# Patient Record
Sex: Male | Born: 1975 | Race: White | Hispanic: No | Marital: Single | Smoking: Never smoker
Health system: Southern US, Community
[De-identification: ages and names within clinical notes are randomized; demographics above are authoritative.]

## PROBLEM LIST (undated history)

## (undated) DIAGNOSIS — C801 Malignant (primary) neoplasm, unspecified: Secondary | ICD-10-CM

## (undated) DIAGNOSIS — I7782 Antineutrophilic cytoplasmic antibody (ANCA) vasculitis: Secondary | ICD-10-CM

## (undated) DIAGNOSIS — I219 Acute myocardial infarction, unspecified: Secondary | ICD-10-CM

## (undated) DIAGNOSIS — I776 Arteritis, unspecified: Secondary | ICD-10-CM

## (undated) DIAGNOSIS — I10 Essential (primary) hypertension: Secondary | ICD-10-CM

## (undated) DIAGNOSIS — I2510 Atherosclerotic heart disease of native coronary artery without angina pectoris: Secondary | ICD-10-CM

## (undated) HISTORY — PX: BRAIN BIOPSY: SHX905

## (undated) HISTORY — PX: HERNIA REPAIR: SHX51

---

## 2019-11-21 ENCOUNTER — Other Ambulatory Visit: Payer: Self-pay

## 2019-11-21 ENCOUNTER — Encounter (HOSPITAL_BASED_OUTPATIENT_CLINIC_OR_DEPARTMENT_OTHER): Payer: Self-pay

## 2019-11-21 ENCOUNTER — Emergency Department (HOSPITAL_BASED_OUTPATIENT_CLINIC_OR_DEPARTMENT_OTHER)
Admission: EM | Admit: 2019-11-21 | Discharge: 2019-11-21 | Disposition: A | Discharge status: Home / Self Care | Payer: 59 | Attending: Emergency Medicine | Admitting: Emergency Medicine

## 2019-11-21 DIAGNOSIS — I10 Essential (primary) hypertension: Secondary | ICD-10-CM | POA: Diagnosis not present

## 2019-11-21 DIAGNOSIS — R358 Other polyuria: Secondary | ICD-10-CM | POA: Insufficient documentation

## 2019-11-21 DIAGNOSIS — R631 Polydipsia: Secondary | ICD-10-CM | POA: Insufficient documentation

## 2019-11-21 DIAGNOSIS — Z79899 Other long term (current) drug therapy: Secondary | ICD-10-CM | POA: Diagnosis not present

## 2019-11-21 DIAGNOSIS — Z7901 Long term (current) use of anticoagulants: Secondary | ICD-10-CM | POA: Diagnosis not present

## 2019-11-21 DIAGNOSIS — C719 Malignant neoplasm of brain, unspecified: Secondary | ICD-10-CM | POA: Insufficient documentation

## 2019-11-21 DIAGNOSIS — R739 Hyperglycemia, unspecified: Secondary | ICD-10-CM | POA: Diagnosis present

## 2019-11-21 HISTORY — DX: Atherosclerotic heart disease of native coronary artery without angina pectoris: I25.10

## 2019-11-21 HISTORY — DX: Antineutrophilic cytoplasmic antibody (ANCA) vasculitis: I77.82

## 2019-11-21 HISTORY — DX: Essential (primary) hypertension: I10

## 2019-11-21 HISTORY — DX: Acute myocardial infarction, unspecified: I21.9

## 2019-11-21 HISTORY — DX: Arteritis, unspecified: I77.6

## 2019-11-21 HISTORY — DX: Malignant (primary) neoplasm, unspecified: C80.1

## 2019-11-21 LAB — CBG MONITORING, ED
Glucose-Capillary: >600 mg/dL (ref 70–99)
Glucose-Capillary: 451 mg/dL — ABNORMAL HIGH (ref 70–99)
Glucose-Capillary: 296 mg/dL — ABNORMAL HIGH (ref 70–99)

## 2019-11-21 LAB — BASIC METABOLIC PANEL
Anion gap: 14 (ref 5–15)
BUN: 20 mg/dL (ref 6–20)
CO2: 22 mmol/L (ref 22–32)
Calcium: 9.2 mg/dL (ref 8.9–10.3)
Chloride: 93 mmol/L — ABNORMAL LOW (ref 98–111)
Creatinine, Ser: 1.37 mg/dL — ABNORMAL HIGH (ref 0.61–1.24)
GFR calc Af Amer: >60 mL/min (ref >60)
GFR calc non Af Amer: >60 mL/min (ref >60)
Glucose, Bld: 732 mg/dL (ref 70–99)
Potassium: 4.6 mmol/L (ref 3.5–5.1)
Sodium: 129 mmol/L — ABNORMAL LOW (ref 135–145)

## 2019-11-21 LAB — CBC WITH DIFFERENTIAL/PLATELET
Abs Immature Granulocytes: 0.2 10*3/uL — ABNORMAL HIGH (ref 0.00–0.07)
Basophils Absolute: 0 10*3/uL (ref 0.0–0.1)
Basophils Relative: 0 %
Eosinophils Absolute: 0 10*3/uL (ref 0.0–0.5)
Eosinophils Relative: 0 %
HCT: 46.2 % (ref 39.0–52.0)
Hemoglobin: 15.9 g/dL (ref 13.0–17.0)
Immature Granulocytes: 2 %
Lymphocytes Relative: 3 %
Lymphs Abs: 0.2 10*3/uL — ABNORMAL LOW (ref 0.7–4.0)
MCH: 33.3 pg (ref 26.0–34.0)
MCHC: 34.4 g/dL (ref 30.0–36.0)
MCV: 96.7 fL (ref 80.0–100.0)
Monocytes Absolute: 0.4 10*3/uL (ref 0.1–1.0)
Monocytes Relative: 4 %
Neutro Abs: 7.9 10*3/uL — ABNORMAL HIGH (ref 1.7–7.7)
Neutrophils Relative %: 91 %
Platelets: 163 10*3/uL (ref 150–400)
RBC: 4.78 MIL/uL (ref 4.22–5.81)
RDW: 14.1 % (ref 11.5–15.5)
WBC: 8.7 10*3/uL (ref 4.0–10.5)
nRBC: 0 % (ref 0.0–0.2)

## 2019-11-21 LAB — URINALYSIS, MICROSCOPIC (REFLEX)

## 2019-11-21 LAB — URINALYSIS, ROUTINE W REFLEX MICROSCOPIC
Bilirubin Urine: NEGATIVE
Glucose, UA: >=500 mg/dL — AB
Ketones, ur: NEGATIVE mg/dL
Leukocytes,Ua: NEGATIVE
Nitrite: NEGATIVE
Protein, ur: NEGATIVE mg/dL
Specific Gravity, Urine: 1.01 (ref 1.005–1.030)
pH: 5.5 (ref 5.0–8.0)

## 2019-11-21 MED ORDER — INSULIN ASPART 100 UNIT/ML IV SOLN
10.0000 [IU] | Freq: Once | INTRAVENOUS | Status: AC
  Administered 2019-11-21: 10 [IU] via INTRAVENOUS
  Filled 2019-11-21: qty 0.1

## 2019-11-21 MED ORDER — LACTATED RINGERS IV BOLUS
2000.0000 mL | Freq: Once | INTRAVENOUS | Status: AC
  Administered 2019-11-21: 2000 mL via INTRAVENOUS

## 2019-11-21 MED ORDER — METFORMIN HCL ER 750 MG PO TB24: 750.0000 mg | ORAL_TABLET | Freq: Every day | ORAL | 0 refills | Status: AC

## 2019-11-21 MED ORDER — LACTATED RINGERS IV BOLUS
500.0000 mL | Freq: Once | INTRAVENOUS | Status: AC
  Administered 2019-11-21: 22:00:00 500 mL via INTRAVENOUS

## 2019-11-21 NOTE — ED Notes (Signed)
Date and time results received: 11/21/19 1950 (use smartphrase ".now" to insert current time)  Test: glucose Critical Value: 732  Name of Provider Notified: Dr. Tyrone Nine  Orders Received? Or Actions Taken?: no new orders

## 2019-11-21 NOTE — ED Provider Notes (Signed)
[facility] EMERGENCY DEPARTMENT Provider Note   CSN: [id number] Arrival date & time: 11/21/19  1807     History Chief Complaint  Patient presents with  . Hyperglycemia    Ronnie Wells is a 44 y.o. male.  45 yo M with a cc of hyperglycemia.  Patient has been having some polyphagia polydipsia polyuria going on for a few days.  Has a history of stage IV glioblastoma.  Is being seen at [facility] with neuro oncology.  Had routine lab work done yesterday which unfortunately showed significant hyperglycemia.  No history of diabetes.  Patient denies cough congestion or fever denies nausea vomiting or diarrhea denies dysuria or frequency or hesitancy.  Denies chest pain or shortness of breath.  Has had some shortness of breath on exertion but this is a chronic problem that is not significantly changed recently.  I did discuss the case with the patient's neuro oncologist who sent him here.  He had just seen him recently in the office.  Felt he was doing well from a neuro oncology standpoint.  He was requesting blood work and assessment for diabetic ketoacidosis.  The history is provided by the patient and the spouse (The patient's neuro oncologist).  Hyperglycemia Associated symptoms: increased thirst and polyuria   Associated symptoms: no abdominal pain, no chest pain, no confusion, no fever, no shortness of breath and no vomiting   Illness Severity:  Moderate Onset quality:  Gradual Duration:  3 days Timing:  Constant Progression:  Worsening Chronicity:  New Associated symptoms: no abdominal pain, no chest pain, no congestion, no diarrhea, no fever, no headaches, no myalgias, no rash, no shortness of breath and no vomiting        Past Medical History:  Diagnosis Date  . 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 Cancer (HCC)    Grade 4 Glioblastoma  . Coronary artery disease   . Hypertension   . MI (myocardial infarction) ([place])     There are no problems to display for  this patient.   Past Surgical History:  Procedure Laterality Date  . BRAIN BIOPSY    . HERNIA REPAIR         No family history on file.  Social History   Tobacco Use  . Smoking status: Never Smoker  . Smokeless tobacco: Never Used  Substance Use Topics  . Alcohol use: Not Currently  . Drug use: Never    Home Medications Prior to Admission medications   Medication Sig Start Date End Date Taking? Authorizing Provider  acetaminophen (TYLENOL) 500 MG tablet Take by mouth. 09/24/19  Yes [provider]  apixaban (ELIQUIS) 2.5 MG TABS tablet Take by mouth. 11/02/16  Yes [provider]  atorvastatin (LIPITOR) 80 MG tablet TAKE 1 TABLET BY MOUTH  DAILY 06/14/19  Yes [provider]  baclofen (LIORESAL) 10 MG tablet Take by mouth. 09/20/19  Yes [provider]  calcium-vitamin D (OSCAL WITH D) 500-200 MG-UNIT TABS tablet Take by mouth. 11/18/19  Yes [provider]  carvedilol (COREG) 6.25 MG tablet TAKE 1 TABLET BY MOUTH  TWICE DAILY WITH MEALS 06/14/19  Yes [provider]  lisinopril (ZESTRIL) 2.5 MG tablet TAKE 1 TABLET BY MOUTH  DAILY 10/04/18  Yes [provider]  nitroGLYCERIN (NITROSTAT) 0.4 MG SL tablet Place under the tongue. 10/07/17  Yes [provider]  ondansetron (ZOFRAN) 8 MG tablet Take by mouth. 10/16/19  Yes [provider]  sulfamethoxazole-trimethoprim (BACTRIM DS) 800-160 MG tablet Take by  mouth. 10/17/19 01/21/20 Yes [provider]  temozolomide (TEMODAR) 140 MG capsule Take by mouth. 10/16/19 11/27/19 Yes [provider]  dexamethasone (DECADRON) 4 MG tablet  10/14/19   [provider]  escitalopram (LEXAPRO) 5 MG tablet Take 5 mg by mouth daily. 10/30/19   [provider]  HYDROcodone-acetaminophen (NORCO/VICODIN) 5-325 MG tablet Take 1 tablet by mouth every 6 (six) hours as needed. 10/26/19   [provider]  metFORMIN (GLUCOPHAGE-XR) 750 MG 24 hr  tablet Take 1 tablet (750 mg total) by mouth daily with breakfast. 11/21/19   Deno Etienne, DO  Valerian Root 450 MG CAPS Take by mouth.    [provider]    Allergies    Iodinated diagnostic agents and Other  Review of Systems   Review of Systems  Constitutional: Negative for chills and fever.  HENT: Negative for congestion and facial swelling.   Eyes: Negative for discharge and visual disturbance.  Respiratory: Negative for shortness of breath.   Cardiovascular: Negative for chest pain and palpitations.  Gastrointestinal: Negative for abdominal pain, diarrhea and vomiting.  Endocrine: Positive for polydipsia, polyphagia and polyuria.  Musculoskeletal: Negative for arthralgias and myalgias.  Skin: Negative for color change and rash.  Neurological: Negative for tremors, syncope and headaches.  Psychiatric/Behavioral: Negative for confusion and dysphoric mood.    Physical Exam Updated Vital Signs BP 108/71 (BP Location: Left Arm)   Pulse 69   Temp 98.3 F (36.8 C) (Oral)   Resp 18   Ht 5\' 10"  (1.778 m)   Wt 84.8 kg   SpO2 93%   BMI 26.83 kg/m   Physical Exam Vitals and nursing note reviewed.  Constitutional:      Appearance: He is well-developed.  HENT:     Head: Normocephalic and atraumatic.  Eyes:     Pupils: Pupils are equal, round, and reactive to light.  Neck:     Vascular: No JVD.  Cardiovascular:     Rate and Rhythm: Normal rate and regular rhythm.     Heart sounds: No murmur. No friction rub. No gallop.   Pulmonary:     Effort: No respiratory distress.     Breath sounds: No wheezing.  Abdominal:     General: There is no distension.     Tenderness: There is no abdominal tenderness. There is no guarding or rebound.  Musculoskeletal:        General: Normal range of motion.     Cervical back: Normal range of motion and neck supple.  Skin:    Coloration: Skin is not pale.     Findings: No rash.  Neurological:     Mental Status: He is alert and  oriented to person, place, and time.  Psychiatric:        Behavior: Behavior normal.     ED Results / Procedures / Treatments   Labs (all labs ordered are listed, but only abnormal results are displayed) Labs Reviewed  CBC WITH DIFFERENTIAL/PLATELET - Abnormal; Notable for the following components:      Result Value   Neutro Abs 7.9 (*)    Lymphs Abs 0.2 (*)    Abs Immature Granulocytes 0.20 (*)    All other components within normal limits  BASIC METABOLIC PANEL - Abnormal; Notable for the following components:   Sodium 129 (*)    Chloride 93 (*)    Glucose, Bld 732 (*)    Creatinine, Ser 1.37 (*)    All other components within normal limits  URINALYSIS,  ROUTINE W REFLEX MICROSCOPIC - Abnormal; Notable for the following components:   Glucose, UA >=500 (*)    Hgb urine dipstick TRACE (*)    All other components within normal limits  URINALYSIS, MICROSCOPIC (REFLEX) - Abnormal; Notable for the following components:   Bacteria, UA FEW (*)    All other components within normal limits  CBG MONITORING, ED - Abnormal; Notable for the following components:   Glucose-Capillary >600 (*)    All other components within normal limits  CBG MONITORING, ED - Abnormal; Notable for the following components:   Glucose-Capillary 451 (*)    All other components within normal limits  CBG MONITORING, ED - Abnormal; Notable for the following components:   Glucose-Capillary 296 (*)    All other components within normal limits  CBG MONITORING, ED    EKG None  Radiology No results found.  Procedures Procedures (including critical care time)  Medications Ordered in ED Medications  lactated ringers bolus 2,000 mL (0 mLs Intravenous Stopped 11/21/19 2115)  insulin aspart (novoLOG) injection 10 Units (10 Units Intravenous Given 11/21/19 2142)  lactated ringers bolus 500 mL (0 mLs Intravenous Stopped 11/21/19 2246)    ED Course  I have reviewed the triage vital signs and the nursing  notes.  Pertinent labs & imaging results that were available during my care of the patient were reviewed by me and considered in my medical decision making (see chart for details).    MDM Rules/Calculators/A&P                      44 yo M with a chief complaint of hyperglycemia.  Noticed incidentally with blood work done.  Sent here for evaluation for diabetic ketoacidosis.  Blood work yesterday actually is concerning for DKA with a gap of 16 and a bicarb of 18.  Repeat today.  2 L of IV fluids.  Reassess.  Patient with significant improvement of his blood sugar from the mid 700s to the mid 400s.  Was given a 500 cc bolus post then a 10 unit bolus of insulin.  Now down below 300.  Will start on Metformin.  Follow-up with his neuro oncologist.  11:08 PM:  I have discussed the diagnosis/risks/treatment options with the patient and believe the pt to be eligible for discharge home to follow-up with PCP, neuro onc. We also discussed returning to the ED immediately if new or worsening sx occur. We discussed the sx which are most concerning (e.g., sudden worsening pain, fever, inability to tolerate by mouth) that necessitate immediate return. Medications administered to the patient during their visit and any new prescriptions provided to the patient are listed below.  Medications given during this visit Medications  lactated ringers bolus 2,000 mL (0 mLs Intravenous Stopped 11/21/19 2115)  insulin aspart (novoLOG) injection 10 Units (10 Units Intravenous Given 11/21/19 2142)  lactated ringers bolus 500 mL (0 mLs Intravenous Stopped 11/21/19 2246)     The patient appears reasonably screen and/or stabilized for discharge and I doubt any other medical condition or other [facility] requiring further screening, evaluation, or treatment in the ED at this time prior to discharge.   Final Clinical Impression(s) / ED Diagnoses Final diagnoses:  Hyperglycemia    Rx / DC Orders ED Discharge Orders         Ordered     metFORMIN (GLUCOPHAGE-XR) 750 MG 24 hr tablet  Daily with breakfast     11/21/19 2234  Deno Etienne, DO 11/21/19 2308

## 2019-11-21 NOTE — ED Triage Notes (Signed)
Pt was sent to ED by oncologist for hyperglycemia. Pt was called and told he had a blood sugar of 760. Pt is seeing oncology at [facility] for a grade 4 glioblastoma. Pt has been on 8mg  of dexamethasone since February.

## 2019-11-21 NOTE — Discharge Instructions (Signed)
Take your medication as prescribed. Return to the ED for worsening symptoms, fever, chest pain.

## 2019-11-28 ENCOUNTER — Encounter (HOSPITAL_BASED_OUTPATIENT_CLINIC_OR_DEPARTMENT_OTHER): Payer: Self-pay | Admitting: Emergency Medicine

## 2019-11-28 ENCOUNTER — Emergency Department (HOSPITAL_BASED_OUTPATIENT_CLINIC_OR_DEPARTMENT_OTHER)
Admission: EM | Admit: 2019-11-28 | Discharge: 2019-11-28 | Disposition: A | Discharge status: Home / Self Care | Payer: 59 | Attending: Emergency Medicine | Admitting: Emergency Medicine

## 2019-11-28 ENCOUNTER — Other Ambulatory Visit: Payer: Self-pay

## 2019-11-28 DIAGNOSIS — Z794 Long term (current) use of insulin: Secondary | ICD-10-CM | POA: Diagnosis not present

## 2019-11-28 DIAGNOSIS — C714 Malignant neoplasm of occipital lobe: Secondary | ICD-10-CM | POA: Diagnosis not present

## 2019-11-28 DIAGNOSIS — I10 Essential (primary) hypertension: Secondary | ICD-10-CM | POA: Insufficient documentation

## 2019-11-28 DIAGNOSIS — I252 Old myocardial infarction: Secondary | ICD-10-CM | POA: Insufficient documentation

## 2019-11-28 DIAGNOSIS — I2510 Atherosclerotic heart disease of native coronary artery without angina pectoris: Secondary | ICD-10-CM | POA: Insufficient documentation

## 2019-11-28 DIAGNOSIS — Z79899 Other long term (current) drug therapy: Secondary | ICD-10-CM | POA: Diagnosis not present

## 2019-11-28 DIAGNOSIS — Z91041 Radiographic dye allergy status: Secondary | ICD-10-CM | POA: Diagnosis not present

## 2019-11-28 DIAGNOSIS — R739 Hyperglycemia, unspecified: Secondary | ICD-10-CM

## 2019-11-28 DIAGNOSIS — E0965 Drug or chemical induced diabetes mellitus with hyperglycemia: Secondary | ICD-10-CM | POA: Diagnosis not present

## 2019-11-28 DIAGNOSIS — E1165 Type 2 diabetes mellitus with hyperglycemia: Secondary | ICD-10-CM | POA: Diagnosis present

## 2019-11-28 LAB — CBC
HCT: 46.5 % (ref 39.0–52.0)
Hemoglobin: 16.7 g/dL (ref 13.0–17.0)
MCH: 33.5 pg (ref 26.0–34.0)
MCHC: 35.9 g/dL (ref 30.0–36.0)
MCV: 93.4 fL (ref 80.0–100.0)
Platelets: 122 10*3/uL — ABNORMAL LOW (ref 150–400)
RBC: 4.98 MIL/uL (ref 4.22–5.81)
RDW: 14.7 % (ref 11.5–15.5)
WBC: 9.3 10*3/uL (ref 4.0–10.5)
nRBC: 0.4 % — ABNORMAL HIGH (ref 0.0–0.2)

## 2019-11-28 LAB — BASIC METABOLIC PANEL
Anion gap: 11 (ref 5–15)
BUN: 24 mg/dL — ABNORMAL HIGH (ref 6–20)
CO2: 19 mmol/L — ABNORMAL LOW (ref 22–32)
Calcium: 7.9 mg/dL — ABNORMAL LOW (ref 8.9–10.3)
Chloride: 103 mmol/L (ref 98–111)
Creatinine, Ser: 0.92 mg/dL (ref 0.61–1.24)
GFR calc Af Amer: >60 mL/min (ref >60)
GFR calc non Af Amer: >60 mL/min (ref >60)
Glucose, Bld: 375 mg/dL — ABNORMAL HIGH (ref 70–99)
Potassium: 3.4 mmol/L — ABNORMAL LOW (ref 3.5–5.1)
Sodium: 133 mmol/L — ABNORMAL LOW (ref 135–145)

## 2019-11-28 LAB — CBG MONITORING, ED
Glucose-Capillary: 351 mg/dL — ABNORMAL HIGH (ref 70–99)
Glucose-Capillary: 372 mg/dL — ABNORMAL HIGH (ref 70–99)
Glucose-Capillary: 314 mg/dL — ABNORMAL HIGH (ref 70–99)

## 2019-11-28 MED ORDER — INSULIN REGULAR HUMAN 100 UNIT/ML IJ SOLN
4.0000 [IU] | Freq: Once | INTRAMUSCULAR | Status: DC
  Filled 2019-11-28: qty 3

## 2019-11-28 MED ORDER — SODIUM CHLORIDE 0.9 % IV BOLUS
1000.0000 mL | Freq: Once | INTRAVENOUS | Status: AC
  Administered 2019-11-28: 1000 mL via INTRAVENOUS

## 2019-11-28 NOTE — ED Triage Notes (Signed)
Pt here for high sugar states it is from the steriods he is on also states that his NA is low

## 2019-11-28 NOTE — ED Provider Notes (Signed)
[facility] EMERGENCY DEPARTMENT Provider Note   CSN: [id number] Arrival date & time: 11/28/19  1938     History Chief Complaint  Patient presents with  . Hyperglycemia    Ronnie Wells is a 44 y.o. male.  He is newly diagnosed with glioblastoma.  He was admitted to [facility] and has been discharged on steroids and Metformin.  They also are covering him with some insulin.  His symptoms from the glioblastoma are some word finding difficulties and some spacing out.  He also has some balance issues and wall surfs per his son.  He is on steroids for edema but has had elevated blood sugars.  The family called his treating physicians and they recommended he come to the emergency department for some IV fluids.  They said his sugar was high and his sodium was low.  There is been no fever cough or worsening of any neurologic symptoms.  History is generally provided by the patient's son although the patient will participate a little bit.  The history is provided by the patient.  Hyperglycemia Blood sugar level PTA:  450 Severity:  Moderate Onset quality:  Gradual Timing:  Intermittent Progression:  Unchanged Chronicity:  New Diabetes status:  Controlled with oral medications Context: recent illness   Relieved by:  Nothing Ineffective treatments:  Insulin and oral agents Associated symptoms: no abdominal pain, no dysuria, no fever, no shortness of breath and no vomiting   Risk factors: recent steroid use        Past Medical History:  Diagnosis Date  . 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 Cancer (HCC)    Grade 4 Glioblastoma  . Coronary artery disease   . Hypertension   . MI (myocardial infarction) ([place])     There are no problems to display for this patient.   Past Surgical History:  Procedure Laterality Date  . BRAIN BIOPSY    . HERNIA REPAIR         No family history on file.  Social History   Tobacco Use  . Smoking status: Never Smoker  . Smokeless  tobacco: Never Used  Substance Use Topics  . Alcohol use: Not Currently  . Drug use: Never    Home Medications Prior to Admission medications   Medication Sig Start Date End Date Taking? Authorizing Provider  acetaminophen (TYLENOL) 500 MG tablet Take by mouth. 09/24/19  Yes [provider]  apixaban (ELIQUIS) 2.5 MG TABS tablet Take by mouth. 11/02/16  Yes [provider]  atorvastatin (LIPITOR) 80 MG tablet TAKE 1 TABLET BY MOUTH  DAILY 06/14/19  Yes [provider]  calcium-vitamin D (OSCAL WITH D) 500-200 MG-UNIT TABS tablet Take by mouth. 11/18/19  Yes [provider]  carvedilol (COREG) 6.25 MG tablet TAKE 1 TABLET BY MOUTH  TWICE DAILY WITH MEALS 06/14/19  Yes [provider]  dexamethasone (DECADRON) 4 MG tablet  10/14/19  Yes [provider]  escitalopram (LEXAPRO) 5 MG tablet Take 5 mg by mouth daily. 10/30/19  Yes [provider]  HYDROcodone-acetaminophen (NORCO/VICODIN) 5-325 MG tablet Take 1 tablet by mouth every 6 (six) hours as needed. 10/26/19  Yes [provider]  lisinopril (ZESTRIL) 2.5 MG tablet TAKE 1 TABLET BY MOUTH  DAILY 10/04/18  Yes [provider]  metFORMIN (GLUCOPHAGE-XR) 750 MG 24 hr tablet Take 1 tablet (750 mg total) by mouth daily with breakfast. 11/21/19  Yes Deno Etienne, DO  nitroGLYCERIN (NITROSTAT) 0.4 MG SL tablet Place under the tongue. 10/07/17  Yes [provider]  ondansetron (ZOFRAN) 8 MG tablet Take by mouth. 10/16/19  Yes [provider]  sulfamethoxazole-trimethoprim (BACTRIM DS) 800-160 MG tablet Take by mouth. 10/17/19 01/21/20 Yes [provider]  Valerian Root 450 MG CAPS Take by mouth.   Yes [provider]  baclofen (LIORESAL) 10 MG tablet Take by mouth. 09/20/19   [provider]    Allergies    Iodinated diagnostic agents and Other  Review of Systems   Review of Systems  Constitutional: Negative for fever.  HENT: Negative  for sore throat.   Eyes: Negative for pain.  Respiratory: Negative for shortness of breath.   Gastrointestinal: Negative for abdominal pain and vomiting.  Genitourinary: Negative for dysuria.  Musculoskeletal: Positive for gait problem.  Skin: Negative for rash.  Neurological: Positive for speech difficulty.    Physical Exam Updated Vital Signs BP 100/72 (BP Location: Right Arm)   Pulse 93   Temp 98.2 F (36.8 C)   Resp 16   SpO2 98%   Physical Exam Vitals and nursing note reviewed.  Constitutional:      Appearance: He is well-developed.  HENT:     Head: Normocephalic and atraumatic.     Comments: He has some cushingoid facies Eyes:     Conjunctiva/sclera: Conjunctivae normal.  Cardiovascular:     Rate and Rhythm: Normal rate and regular rhythm.     Heart sounds: No murmur.  Pulmonary:     Effort: Pulmonary effort is normal. No respiratory distress.     Breath sounds: Normal breath sounds.  Abdominal:     Palpations: Abdomen is soft.     Tenderness: There is no abdominal tenderness.  Musculoskeletal:        General: No deformity or signs of injury.     Cervical back: Neck supple.     Comments: He is got some bruising on his forearms from IV sticks.  Skin:    General: Skin is warm and dry.     Capillary Refill: Capillary refill takes less than 2 seconds.  Neurological:     Mental Status: He is alert. Mental status is at baseline.     ED Results / Procedures / Treatments   Labs (all labs ordered are listed, but only abnormal results are displayed) Labs Reviewed  CBC - Abnormal; Notable for the following components:      Result Value   Platelets 122 (*)    nRBC 0.4 (*)    All other components within normal limits  BASIC METABOLIC PANEL - Abnormal; Notable for the following components:   Sodium 133 (*)    Potassium 3.4 (*)    CO2 19 (*)    Glucose, Bld 375 (*)    BUN 24 (*)    Calcium 7.9 (*)    All other components within normal limits  CBG MONITORING,  ED - Abnormal; Notable for the following components:   Glucose-Capillary 351 (*)    All other components within normal limits  CBG MONITORING, ED - Abnormal; Notable for the following components:   Glucose-Capillary 372 (*)    All other components within normal limits  CBG MONITORING, ED - Abnormal; Notable for the following components:   Glucose-Capillary 314 (*)    All other components within normal limits  CBG MONITORING, ED  CBG MONITORING, ED    EKG None  Radiology No results found.  Procedures Procedures (including critical care time)  Medications Ordered in ED Medications  sodium chloride 0.9 % bolus 1,000  mL ( Intravenous Stopped 11/28/19 2313)    ED Course  I have reviewed the triage vital signs and the nursing notes.  Pertinent labs & imaging results that were available during my care of the patient were reviewed by me and considered in my medical decision making (see chart for details).  Clinical Course as of Nov 28 932  Mon Nov 28, 2019  2319 Patient's blood sugar down to 314.  He says he feels better and would like to be discharged.  He understands to contact his doctors at [facility] tomorrow for further management of his elevated blood sugars.   [MB]    Clinical Course User Index [MB] Hayden Rasmussen, MD   MDM Rules/Calculators/A&P                     44 year old male with recent diagnosis of glio on steroids with elevated blood sugars.  I reviewed his notes in epic from [facility] admission and discharge summary talking about controlling his sugars with some long-acting insulin.  Blood sugar elevated here with low sodium likely just pseudohyponatremia.  Low bicarb but normal gap so probably reflects more some dehydration.  His team of doctors at [facility] sent him to the emergency department to get some IV fluids.  His sugar is improved here.  I offered to have him stay longer and get some subcu insulin and some more fluids but he said he felt better and looked very  tired.  His son is taking him home and they will continue to monitor sugars.  Return instructions discussed  Final Clinical Impression(s) / ED Diagnoses Final diagnoses:  Steroid-induced hyperglycemia    Rx / DC Orders ED Discharge Orders    None       Hayden Rasmussen, MD 11/29/19 [phone]

## 2019-11-28 NOTE — Discharge Instructions (Signed)
You were seen in the emergency department for an elevated blood sugar likely related to your steroids.  You received some IV fluids here with improvement in your blood sugar.  Please continue your regular medications and contact your doctors at [facility] for further management of your sugars.  Return to the emergency department if any worsening or concerning symptoms

## 2019-11-28 NOTE — ED Notes (Signed)
Patient family further states he had labs drawn at oncology today with elevated cbg and decreased NA.

## 2019-12-21 ENCOUNTER — Emergency Department (HOSPITAL_BASED_OUTPATIENT_CLINIC_OR_DEPARTMENT_OTHER)
Admission: EM | Admit: 2019-12-21 | Discharge: 2019-12-22 | Disposition: A | Discharge status: Home / Self Care | Payer: 59 | Attending: Emergency Medicine | Admitting: Emergency Medicine

## 2019-12-21 ENCOUNTER — Encounter (HOSPITAL_BASED_OUTPATIENT_CLINIC_OR_DEPARTMENT_OTHER): Payer: Self-pay | Admitting: *Deleted

## 2019-12-21 ENCOUNTER — Emergency Department (HOSPITAL_BASED_OUTPATIENT_CLINIC_OR_DEPARTMENT_OTHER): Payer: 59

## 2019-12-21 ENCOUNTER — Other Ambulatory Visit: Payer: Self-pay

## 2019-12-21 DIAGNOSIS — I10 Essential (primary) hypertension: Secondary | ICD-10-CM | POA: Diagnosis not present

## 2019-12-21 DIAGNOSIS — K611 Rectal abscess: Secondary | ICD-10-CM | POA: Diagnosis not present

## 2019-12-21 DIAGNOSIS — I252 Old myocardial infarction: Secondary | ICD-10-CM | POA: Diagnosis not present

## 2019-12-21 DIAGNOSIS — A419 Sepsis, unspecified organism: Secondary | ICD-10-CM | POA: Diagnosis not present

## 2019-12-21 DIAGNOSIS — I2510 Atherosclerotic heart disease of native coronary artery without angina pectoris: Secondary | ICD-10-CM | POA: Insufficient documentation

## 2019-12-21 DIAGNOSIS — K6289 Other specified diseases of anus and rectum: Secondary | ICD-10-CM | POA: Diagnosis present

## 2019-12-21 DIAGNOSIS — Z85841 Personal history of malignant neoplasm of brain: Secondary | ICD-10-CM | POA: Insufficient documentation

## 2019-12-21 DIAGNOSIS — Z7901 Long term (current) use of anticoagulants: Secondary | ICD-10-CM | POA: Diagnosis not present

## 2019-12-21 DIAGNOSIS — Z20822 Contact with and (suspected) exposure to covid-19: Secondary | ICD-10-CM | POA: Diagnosis not present

## 2019-12-21 DIAGNOSIS — R5383 Other fatigue: Secondary | ICD-10-CM | POA: Insufficient documentation

## 2019-12-21 DIAGNOSIS — Z79899 Other long term (current) drug therapy: Secondary | ICD-10-CM | POA: Insufficient documentation

## 2019-12-21 LAB — CBC WITH DIFFERENTIAL/PLATELET
Abs Immature Granulocytes: 0.24 10*3/uL — ABNORMAL HIGH (ref 0.00–0.07)
Basophils Absolute: 0 10*3/uL (ref 0.0–0.1)
Basophils Relative: 0 %
Eosinophils Absolute: 0 10*3/uL (ref 0.0–0.5)
Eosinophils Relative: 0 %
HCT: 45.3 % (ref 39.0–52.0)
Hemoglobin: 16.1 g/dL (ref 13.0–17.0)
Immature Granulocytes: 3 %
Lymphocytes Relative: 3 %
Lymphs Abs: 0.3 10*3/uL — ABNORMAL LOW (ref 0.7–4.0)
MCH: 34.9 pg — ABNORMAL HIGH (ref 26.0–34.0)
MCHC: 35.5 g/dL (ref 30.0–36.0)
MCV: 98.3 fL (ref 80.0–100.0)
Monocytes Absolute: 0.3 10*3/uL (ref 0.1–1.0)
Monocytes Relative: 3 %
Neutro Abs: 8.5 10*3/uL — ABNORMAL HIGH (ref 1.7–7.7)
Neutrophils Relative %: 91 %
Platelets: 122 10*3/uL — ABNORMAL LOW (ref 150–400)
RBC: 4.61 MIL/uL (ref 4.22–5.81)
RDW: 16.3 % — ABNORMAL HIGH (ref 11.5–15.5)
Smear Review: NORMAL
WBC: 9.4 10*3/uL (ref 4.0–10.5)
nRBC: 0 % (ref 0.0–0.2)

## 2019-12-21 LAB — SARS CORONAVIRUS 2 BY RT PCR (HOSPITAL ORDER, PERFORMED IN ~~LOC~~ HOSPITAL LAB): SARS Coronavirus 2: NEGATIVE

## 2019-12-21 LAB — BASIC METABOLIC PANEL
Anion gap: 14 (ref 5–15)
BUN: 19 mg/dL (ref 6–20)
CO2: 22 mmol/L (ref 22–32)
Calcium: 9 mg/dL (ref 8.9–10.3)
Chloride: 96 mmol/L — ABNORMAL LOW (ref 98–111)
Creatinine, Ser: 0.86 mg/dL (ref 0.61–1.24)
GFR calc Af Amer: >60 mL/min (ref >60)
GFR calc non Af Amer: >60 mL/min (ref >60)
Glucose, Bld: 164 mg/dL — ABNORMAL HIGH (ref 70–99)
Potassium: 4.4 mmol/L (ref 3.5–5.1)
Sodium: 132 mmol/L — ABNORMAL LOW (ref 135–145)

## 2019-12-21 LAB — LACTIC ACID, PLASMA: Lactic Acid, Venous: 3.4 mmol/L (ref 0.5–1.9)

## 2019-12-21 MED ORDER — SODIUM CHLORIDE 0.9 % IV BOLUS
1000.0000 mL | Freq: Once | INTRAVENOUS | Status: AC
  Administered 2019-12-21: 1000 mL via INTRAVENOUS

## 2019-12-21 MED ORDER — ONDANSETRON HCL 4 MG/2ML IJ SOLN
4.0000 mg | Freq: Once | INTRAMUSCULAR | Status: AC
  Administered 2019-12-21: 4 mg via INTRAVENOUS
  Filled 2019-12-21: qty 2

## 2019-12-21 MED ORDER — LIDOCAINE-EPINEPHRINE (PF) 2 %-1:200000 IJ SOLN
INTRAMUSCULAR | Status: AC
  Filled 2019-12-21: qty 10

## 2019-12-21 MED ORDER — PIPERACILLIN-TAZOBACTAM 3.375 G IVPB 30 MIN
3.3750 g | Freq: Once | INTRAVENOUS | Status: AC
  Administered 2019-12-21: 3.375 g via INTRAVENOUS
  Filled 2019-12-21 (×2): qty 50

## 2019-12-21 MED ORDER — SODIUM CHLORIDE 0.9 % IV BOLUS
1000.0000 mL | Freq: Once | INTRAVENOUS | Status: AC
  Administered 2019-12-22: 1000 mL via INTRAVENOUS

## 2019-12-21 MED ORDER — LIDOCAINE-EPINEPHRINE (PF) 2 %-1:200000 IJ SOLN
10.0000 mL | Freq: Once | INTRAMUSCULAR | Status: AC
  Administered 2019-12-21: 10 mL

## 2019-12-21 MED ORDER — MORPHINE SULFATE (PF) 4 MG/ML IV SOLN
4.0000 mg | Freq: Once | INTRAVENOUS | Status: AC
  Administered 2019-12-21: 4 mg via INTRAVENOUS
  Filled 2019-12-21: qty 1

## 2019-12-21 NOTE — ED Notes (Addendum)
Called [facility]  Spoke to [facility] to page Dr. Landry Dyke

## 2019-12-21 NOTE — ED Triage Notes (Signed)
Pt c/o wound to rectum x 1 week

## 2019-12-21 NOTE — ED Provider Notes (Signed)
[facility] EMERGENCY DEPARTMENT Provider Note   CSN: [id number] Arrival date & time: 12/21/19  1843     History Chief Complaint  Patient presents with  . Wound Check    Ronnie Wells is a 44 y.o. 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  He is mostly bedbound.  He is complained of pain near his rectum for about a week.  They saw the oncology urgent care yesterday and were prescribed an antibiotic that has not been started yet.  Today she noticed some foul drainage from the wound.  Continues to have pain at that site.  No fever but has felt unwell.  Still moving his bowels normally.  The history is provided by the patient.  Abscess Location:  Pelvis Pelvic abscess location:  Perianal Size:  4 Abscess quality: draining, fluctuance, induration and painful   Red streaking: no   Progression:  Worsening Pain details:    Quality:  Throbbing   Severity:  Moderate   Timing:  Constant   Progression:  Worsening Chronicity:  New Context: immunosuppression   Relieved by:  Nothing Exacerbated by: pressure. Ineffective treatments:  None tried Associated symptoms: fatigue   Associated symptoms: no fever        Past Medical History:  Diagnosis Date  . 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 Cancer (HCC)    Grade 4 Glioblastoma  . Coronary artery disease   . Hypertension   . MI (myocardial infarction) ([place])     There are no problems to display for this patient.   Past Surgical History:  Procedure Laterality Date  . BRAIN BIOPSY    . HERNIA REPAIR         History reviewed. No pertinent family history.  Social History   Tobacco Use  . Smoking status: Never Smoker  . Smokeless tobacco: Never Used  Substance Use Topics  . Alcohol use: Not Currently  . Drug use: Never    Home Medications Prior to Admission medications   Medication Sig Start Date End Date Taking? Authorizing Provider    acetaminophen (TYLENOL) 500 MG tablet Take by mouth. 09/24/19   [provider]  apixaban (ELIQUIS) 2.5 MG TABS tablet Take by mouth. 11/02/16   [provider]  atorvastatin (LIPITOR) 80 MG tablet TAKE 1 TABLET BY MOUTH  DAILY 06/14/19   [provider]  baclofen (LIORESAL) 10 MG tablet Take by mouth. 09/20/19   [provider]  calcium-vitamin D (OSCAL WITH D) 500-200 MG-UNIT TABS tablet Take by mouth. 11/18/19   [provider]  carvedilol (COREG) 6.25 MG tablet TAKE 1 TABLET BY MOUTH  TWICE DAILY WITH MEALS 06/14/19   [provider]  dexamethasone (DECADRON) 4 MG tablet  10/14/19   [provider]  escitalopram (LEXAPRO) 5 MG tablet Take 5 mg by mouth daily. 10/30/19   [provider]  HYDROcodone-acetaminophen (NORCO/VICODIN) 5-325 MG tablet Take 1 tablet by mouth every 6 (six) hours as needed. 10/26/19   [provider]  insulin glargine (LANTUS) 100 UNIT/ML injection Inject 12 Units into the skin daily.    [provider]  lisinopril (ZESTRIL) 2.5 MG tablet TAKE 1 TABLET BY MOUTH  DAILY 10/04/18   [provider]  metFORMIN (GLUCOPHAGE-XR) 750 MG 24 hr tablet Take 1 tablet (750 mg total) by mouth daily with breakfast. 11/21/19   Deno Etienne, DO  nitroGLYCERIN (NITROSTAT) 0.4 MG SL tablet Place under the  tongue. 10/07/17   [provider]  ondansetron (ZOFRAN) 8 MG tablet Take by mouth. 10/16/19   [provider]  sulfamethoxazole-trimethoprim (BACTRIM DS) 800-160 MG tablet Take by mouth. 10/17/19 01/21/20  [provider]  Valerian Root 450 MG CAPS Take by mouth.    [provider]    Allergies    Iodinated diagnostic agents and Other  Review of Systems   Review of Systems  Constitutional: Positive for fatigue. Negative for fever.  HENT: Negative for sore throat.   Eyes: Negative for visual disturbance.  Respiratory: Negative for shortness of breath.    Cardiovascular: Negative for chest pain.  Gastrointestinal: Negative for abdominal pain.  Genitourinary: Negative for dysuria.  Musculoskeletal: Positive for gait problem.  Skin: Positive for wound. Negative for rash.  Neurological: Positive for speech difficulty.    Physical Exam Updated Vital Signs BP 101/73   Pulse 92   Temp 97.9 F (36.6 C)   Resp 18   Ht 5\' 8"  (1.727 m)   Wt 85.3 kg   SpO2 98%   BMI 28.59 kg/m   Physical Exam Vitals and nursing note reviewed.  Constitutional:      Appearance: He is well-developed.  HENT:     Head: Normocephalic and atraumatic.  Eyes:     Conjunctiva/sclera: Conjunctivae normal.  Cardiovascular:     Rate and Rhythm: Normal rate and regular rhythm.     Heart sounds: No murmur.  Pulmonary:     Effort: Pulmonary effort is normal. No respiratory distress.     Breath sounds: Normal breath sounds.  Abdominal:     Palpations: Abdomen is soft.     Tenderness: There is no abdominal tenderness.  Musculoskeletal:        General: No deformity or signs of injury.     Cervical back: Neck supple.  Skin:    General: Skin is warm and dry.     Capillary Refill: Capillary refill takes less than 2 seconds.  Neurological:     Mental Status: He is alert. Mental status is at baseline.     Cranial Nerves: Cranial nerve deficit (wordfinding difficulty) present.     ED Results / Procedures / Treatments   Labs (all labs ordered are listed, but only abnormal results are displayed) Labs Reviewed  BASIC METABOLIC PANEL - Abnormal; Notable for the following components:      Result Value   Sodium 132 (*)    Chloride 96 (*)    Glucose, Bld 164 (*)    All other components within normal limits  CBC WITH DIFFERENTIAL/PLATELET - Abnormal; Notable for the following components:   MCH 34.9 (*)    RDW 16.3 (*)    Platelets 122 (*)    Neutro Abs 8.5 (*)    Lymphs Abs 0.3 (*)    Abs Immature Granulocytes 0.24 (*)    All other components within normal  limits  LACTIC ACID, PLASMA - Abnormal; Notable for the following components:   Lactic Acid, Venous 3.4 (*)    All other components within normal limits  CULTURE, BLOOD (ROUTINE X 2)  CULTURE, BLOOD (ROUTINE X 2)  AEROBIC/ANAEROBIC CULTURE (SURGICAL/DEEP WOUND)  SARS CORONAVIRUS 2 BY RT PCR (HOSPITAL ORDER, [place])  LACTIC ACID, PLASMA    EKG None  Radiology CT PELVIS WO CONTRAST  Result Date: 12/21/2019 CLINICAL DATA:  44 year old male with anal rectal abscess. Glioblastoma. EXAM: CT PELVIS WITHOUT CONTRAST TECHNIQUE: Multidetector CT imaging of the pelvis was performed  following the standard protocol without intravenous contrast. COMPARISON:  None. FINDINGS: Urinary Tract:  Distal ureters and urinary bladder appear normal. Bowel: Right side perianal soft tissue thickening and inflammation with a small collection of hyperdense material and soft tissue gas. See series 2, image 52 and series 4 coronal image 70. This is 8-9 mm diameter. Mild contralateral left subcutaneous inflammatory appearing nodularity (series 2, image 50). The upstream rectum appears to remain normal. Other visible large and small bowel loops in the lower abdomen and pelvis appear negative. Normal appendix on coronal image 30. Vascular/Lymphatic: Vascular patency is not evaluated in the absence of IV contrast. No atherosclerosis identified. No pelvic or inguinal lymphadenopathy. Reproductive:  Prior vasectomy, otherwise negative. Other:  No pelvic free fluid. Musculoskeletal: Negative. IMPRESSION: 1. Small right Perianal Abscess (8 mm partially decompressed abscess vs phlegmon). See series 2, image 52. Regional bilateral perianal subcutaneous inflammation, greater on the right. 2. No pelvic floor involvement or other complicating features. Electronically Signed   By: Genevie Ann M.D.   On: 12/21/2019 21:50    Procedures .Mar[facility] KitchenIncision and Drainage  Date/Time: 12/21/2019 11:03 PM Performed by: Hayden Rasmussen, MD Authorized by: Hayden Rasmussen, MD   Consent:    Consent obtained:  Verbal   Consent given by:  Patient   Risks discussed:  Bleeding, incomplete drainage, pain and infection   Alternatives discussed:  No treatment, delayed treatment and referral Location:    Type:  Abscess   Size:  3   Location:  Anogenital   Anogenital location:  Perirectal Pre-procedure details:    Skin preparation:  Betadine Anesthesia (see MAR for exact dosages):    Anesthesia method:  Local infiltration   Local anesthetic:  Lidocaine 2% WITH epi Procedure type:    Complexity:  Complex Procedure details:    Incision types:  Single straight   Scalpel blade:  15   Wound management:  Probed and deloculated   Drainage:  Purulent   Drainage amount:  Moderate   Packing materials:  1/4 in iodoform gauze   Amount 1/4" iodoform:  5 Post-procedure details:    Patient tolerance of procedure:  Tolerated well, no immediate complications .Critical Care Performed by: Hayden Rasmussen, MD Authorized by: Hayden Rasmussen, MD   Critical care provider statement:    Critical care time (minutes):  45   Critical care time was exclusive of:  Separately billable procedures and treating other patients   Critical care was necessary to treat or prevent imminent or life-threatening deterioration of the following conditions:  Sepsis   Critical care was time spent personally by me on the following activities:  Discussions with consultants, evaluation of patient's response to treatment, examination of patient, ordering and performing treatments and interventions, ordering and review of laboratory studies, ordering and review of radiographic studies, pulse oximetry, re-evaluation of patient's condition, obtaining history from patient or surrogate, review of old charts and development of treatment plan with patient or surrogate   I assumed direction of critical care for this patient from another provider in my specialty:  no     (including critical care time)  Medications Ordered in ED Medications  lidocaine-EPINEPHrine (XYLOCAINE W/EPI) 2 %-1:200000 (PF) injection (has no administration in time range)  sodium chloride 0.9 % bolus 1,000 mL (has no administration in time range)  lidocaine-EPINEPHrine (XYLOCAINE W/EPI) 2 %-1:200000 (PF) injection 10 mL (10 mLs Infiltration Given 12/21/19 2144)  morphine 4 MG/ML injection 4 mg (4 mg Intravenous Given 12/21/19 2123)  ondansetron (  ZOFRAN) injection 4 mg (4 mg Intravenous Given 12/21/19 2122)  sodium chloride 0.9 % bolus 1,000 mL (1,000 mLs Intravenous New Bag/Given 12/21/19 2224)  piperacillin-tazobactam (ZOSYN) IVPB 3.375 g (3.375 g Intravenous New Bag/Given 12/21/19 2226)    ED Course  I have reviewed the triage vital signs and the nursing notes.  Pertinent labs & imaging results that were available during my care of the patient were reviewed by me and considered in my medical decision making (see chart for details).  Clinical Course as of Dec 20 2298  Wed Dec 21, 2019  2210 Patient's lactate elevated although does not have a fever.  Blood pressure a little soft but his baseline.  Not tachycardic not tachypneic not hypoxic.  Labs showing a normal white count.  Have ordered him IV fluids and Zosyn.   [MB]  2252 Due to elevated lactate I reached out to oncology on-call at [facility] with Dr. Dorene Grebe who is accepting him in transfer to heme-onc floor at [facility].  This is agreeable to patient and his significant other.   [MB]  2253 I reviewed the current labs with the patient significant other.  She says his blood pressure is usually high.  This makes this a little more concerned and that is probably showing some signs of sepsis.  I have ordered a second liter of fluid.  Awaiting bed availability at [facility].   [MB]    Clinical Course User Index [MB] Hayden Rasmussen, MD   MDM Rules/Calculators/A&P                     This patient complains of  buttock pain; this involves an extensive number of treatment Options and is a complaint that carries with it a high risk of complications and Morbidity. The differential includes ulcer, abscess, sepsis, metabolic derangement,  I ordered, reviewed and interpreted labs, which included CBC which shows a normal white count, low platelets.  Chemistry showing a low sodium and elevated glucose.  Lactic acid elevation of 3.4.  Covid testing and blood cultures wound culture pending. I ordered medication pain medication IV fluids and Zosyn I ordered imaging studies which included CT pelvis noncontrast and I independently    visualized and interpreted imaging which showed partially drained abscess and some stranding around it Additional history obtained from patient significant other Previous records obtained and reviewed in [facility] via epic I consulted heme-onc at [place] And discussed lab and imaging findings  Critical Interventions: Identification of likely sepsis in this immune compromised patient.  Administration of IV fluids and antibiotics.  Appropriate disposition.  After the interventions stated above, I reevaluated the patient and found pain to be improved.  He will need further management of this.  Awaiting on bed at [facility].   Final Clinical Impression(s) / ED Diagnoses Final diagnoses:  Perirectal abscess  Sepsis without acute organ dysfunction, due to unspecified organism [facility])    Rx / DC Orders ED Discharge Orders    None       Hayden Rasmussen, MD 12/21/19 2307

## 2019-12-21 NOTE — ED Notes (Signed)
Date and time results received: 12/21/19 2233   Test: lactic acid Critical Value: 3.4  Name of Provider Notified: Melina Copa

## 2019-12-22 DIAGNOSIS — Z85841 Personal history of malignant neoplasm of brain: Secondary | ICD-10-CM | POA: Diagnosis not present

## 2019-12-22 DIAGNOSIS — R5383 Other fatigue: Secondary | ICD-10-CM | POA: Diagnosis not present

## 2019-12-22 DIAGNOSIS — I2510 Atherosclerotic heart disease of native coronary artery without angina pectoris: Secondary | ICD-10-CM | POA: Diagnosis not present

## 2019-12-22 DIAGNOSIS — K611 Rectal abscess: Secondary | ICD-10-CM | POA: Diagnosis not present

## 2019-12-22 DIAGNOSIS — I252 Old myocardial infarction: Secondary | ICD-10-CM | POA: Diagnosis not present

## 2019-12-22 DIAGNOSIS — I10 Essential (primary) hypertension: Secondary | ICD-10-CM | POA: Diagnosis not present

## 2019-12-22 DIAGNOSIS — A419 Sepsis, unspecified organism: Secondary | ICD-10-CM | POA: Diagnosis not present

## 2019-12-22 DIAGNOSIS — Z20822 Contact with and (suspected) exposure to covid-19: Secondary | ICD-10-CM | POA: Diagnosis not present

## 2019-12-22 DIAGNOSIS — Z7901 Long term (current) use of anticoagulants: Secondary | ICD-10-CM | POA: Diagnosis not present

## 2019-12-22 DIAGNOSIS — Z79899 Other long term (current) drug therapy: Secondary | ICD-10-CM | POA: Diagnosis not present

## 2019-12-22 DIAGNOSIS — K6289 Other specified diseases of anus and rectum: Secondary | ICD-10-CM | POA: Diagnosis present

## 2019-12-22 LAB — LACTIC ACID, PLASMA: Lactic Acid, Venous: 2.5 mmol/L (ref 0.5–1.9)

## 2019-12-22 MED ORDER — MORPHINE SULFATE (PF) 4 MG/ML IV SOLN
4.0000 mg | Freq: Once | INTRAVENOUS | Status: AC
  Administered 2019-12-22: 4 mg via INTRAVENOUS

## 2019-12-22 MED ORDER — MORPHINE SULFATE (PF) 4 MG/ML IV SOLN
INTRAVENOUS | Status: AC
  Filled 2019-12-22: qty 1

## 2019-12-22 NOTE — ED Notes (Addendum)
Called [employer] to cancel transport and spoke to [place] @ [phone]. [facility] will transport pt

## 2019-12-22 NOTE — ED Notes (Signed)
Date and time results received: 05/20/210012   Test: lactic acid Critical Value: 2.5  Name of Provider Notified: Dr Florina Ou

## 2019-12-23 MED ORDER — CARVEDILOL 3.125 MG PO TABS
3.13 | ORAL_TABLET | ORAL | Status: DC
Start: 2019-12-23 — End: 2019-12-23

## 2019-12-23 MED ORDER — GLUCOSE 40 % PO GEL
15.00 | ORAL | Status: DC
Start: ? — End: 2019-12-23

## 2019-12-23 MED ORDER — APIXABAN 5 MG PO TABS
5.00 | ORAL_TABLET | ORAL | Status: DC
Start: 2019-12-23 — End: 2019-12-23

## 2019-12-23 MED ORDER — DEXTROSE 10 % IV SOLN
125.00 | INTRAVENOUS | Status: DC
Start: ? — End: 2019-12-23

## 2019-12-23 MED ORDER — OXYCODONE HCL 5 MG PO TABS
5.00 | ORAL_TABLET | ORAL | Status: DC
Start: ? — End: 2019-12-23

## 2019-12-23 MED ORDER — DEXAMETHASONE 4 MG PO TABS
4.00 | ORAL_TABLET | ORAL | Status: DC
Start: 2019-12-24 — End: 2019-12-23

## 2019-12-23 MED ORDER — SULFAMETHOXAZOLE-TRIMETHOPRIM 800-160 MG PO TABS
1.00 | ORAL_TABLET | ORAL | Status: DC
Start: 2019-12-26 — End: 2019-12-23

## 2019-12-23 MED ORDER — NITROGLYCERIN 0.4 MG SL SUBL
0.40 | SUBLINGUAL_TABLET | SUBLINGUAL | Status: DC
Start: ? — End: 2019-12-23

## 2019-12-23 MED ORDER — INSULIN LISPRO 100 UNIT/ML ~~LOC~~ SOLN
2.00 | SUBCUTANEOUS | Status: DC
Start: 2019-12-23 — End: 2019-12-23

## 2019-12-23 MED ORDER — ESCITALOPRAM OXALATE 10 MG PO TABS
10.00 | ORAL_TABLET | ORAL | Status: DC
Start: 2019-12-24 — End: 2019-12-23

## 2019-12-23 MED ORDER — PANTOPRAZOLE SODIUM 40 MG PO TBEC
40.00 | DELAYED_RELEASE_TABLET | ORAL | Status: DC
Start: 2019-12-24 — End: 2019-12-23

## 2019-12-23 MED ORDER — GENERIC EXTERNAL MEDICATION
15.00 | Status: DC
Start: 2019-12-23 — End: 2019-12-23

## 2019-12-23 MED ORDER — INSULIN GLARGINE 100 UNIT/ML ~~LOC~~ SOLN
20.00 | SUBCUTANEOUS | Status: DC
Start: 2019-12-23 — End: 2019-12-23

## 2019-12-23 MED ORDER — GENERIC EXTERNAL MEDICATION
3.38 | Status: DC
Start: 2019-12-23 — End: 2019-12-23

## 2019-12-23 MED ORDER — LACTATED RINGERS IV SOLN
INTRAVENOUS | Status: DC
Start: ? — End: 2019-12-23

## 2019-12-23 MED ORDER — LISINOPRIL 2.5 MG PO TABS
2.50 | ORAL_TABLET | ORAL | Status: DC
Start: 2019-12-23 — End: 2019-12-23

## 2019-12-26 LAB — AEROBIC CULTURE W GRAM STAIN (SUPERFICIAL SPECIMEN): Culture: NORMAL

## 2019-12-27 ENCOUNTER — Telehealth: Payer: Self-pay | Admitting: *Deleted

## 2019-12-27 LAB — ANAEROBIC CULTURE

## 2019-12-27 LAB — CULTURE, BLOOD (ROUTINE X 2)
Culture: NO GROWTH
Special Requests: ADEQUATE

## 2019-12-27 NOTE — Telephone Encounter (Signed)
Post ED Visit - Positive Culture Follow-up  Culture report reviewed by antimicrobial stewardship pharmacist: [place] Team []  Elenor Quinones, Pharm.D. []  Heide Guile, Pharm.D., BCPS AQ-ID []  Parks Neptune, Pharm.D., BCPS []  Alycia Rossetti, Pharm.D., BCPS []  [place], Pharm.D., BCPS, AAHIVP []  Legrand Como, Pharm.D., BCPS, AAHIVP []  Salome Arnt, PharmD, BCPS []  Johnnette Gourd, PharmD, BCPS []  Hughes Better, PharmD, BCPS []  Leeroy Cha, PharmD []  Laqueta Linden, PharmD, BCPS []  Albertina Parr, PharmD Acey Lav, [place] Team []  Leodis Sias, PharmD []  Lindell Spar, PharmD []  Royetta Asal, PharmD []  Graylin Shiver, Rph []  Rema Fendt) [place] Mac, PharmD []  Arlyn Dunning, PharmD []  Netta Cedars, PharmD []  Dia Sitter, PharmD []  Leone Haven, PharmD []  Gretta Arab, PharmD []  Theodis Shove, PharmD []  Peggyann Juba, PharmD []  Reuel Boom, PharmD   Positive wound culture Treated with Cephalexin, organism sensitive to the same and no further patient follow-up is required at this time.  Harlon Flor [facility] 12/27/2019, 2:14 PM

## 2019-12-28 ENCOUNTER — Telehealth: Payer: Self-pay | Admitting: *Deleted

## 2019-12-28 NOTE — Telephone Encounter (Signed)
Post ED Visit - Positive Culture Follow-up  Culture report reviewed by antimicrobial stewardship pharmacist: [place] Team []  Elenor Quinones, Pharm.D. []  Heide Guile, Pharm.D., BCPS AQ-ID []  Parks Neptune, Pharm.D., BCPS []  Alycia Rossetti, Pharm.D., BCPS []  [place], [place].D., BCPS, AAHIVP []  Legrand Como, Pharm.D., BCPS, AAHIVP []  Salome Arnt, PharmD, BCPS []  Johnnette Gourd, PharmD, BCPS []  Hughes Better, PharmD, BCPS []  Leeroy Cha, PharmD []  Laqueta Linden, PharmD, BCPS []  Albertina Parr, PharmD  [place] Team []  Leodis Sias, PharmD []  Lindell Spar, PharmD []  Royetta Asal, PharmD []  Graylin Shiver, Rph []  Rema Fendt) [place] Mac, PharmD []  Arlyn Dunning, PharmD []  Netta Cedars, PharmD []  Dia Sitter, PharmD []  Leone Haven, PharmD []  Gretta Arab, PharmD []  Theodis Shove, PharmD []  Peggyann Juba, PharmD []  Reuel Boom, PharmD   Positive wound culture Admitted to [facility] x 3 days and now home and feeling better.  Drainage from wound and pain resolved.  No further patient follow-up is required at this time.  Harlon Flor [facility] 12/28/2019, 9:50 AM

## 2019-12-28 NOTE — Progress Notes (Signed)
ED Antimicrobial Stewardship Positive Culture Follow Up   Ronnie Wells is an 44 y.o. male who presented to [facility] on 12/21/2019 with a chief complaint of  Chief Complaint  Patient presents with  . Wound Check    Recent Results (from the past 720 hour(s))  Culture, blood (routine x 2)     Status: None   Collection Time: 12/21/19  9:46 PM   Specimen: BLOOD  Result Value Ref Range Status   Specimen Description   Final    BLOOD LEFT ANTECUBITAL Performed at [facility], [address] [place], [place] [address]    Special Requests   Final    BOTTLES DRAWN AEROBIC AND ANAEROBIC Blood Culture adequate volume Performed at [facility], [address] [place], [place] [address]    Culture   Final    NO GROWTH 5 DAYS Performed at [facility], [place] [address], [place], [place] [address]    Report Status 12/27/2019 FINAL  Final  SARS Coronavirus 2 by RT PCR (hospital order, performed in [facility] hospital lab) Nasopharyngeal Perirectal     Status: None   Collection Time: 12/21/19  9:46 PM   Specimen: Perirectal; Nasopharyngeal  Result Value Ref Range Status   SARS Coronavirus 2 NEGATIVE NEGATIVE Final    Comment: (NOTE) SARS-CoV-2 target nucleic acids are NOT DETECTED. The SARS-CoV-2 RNA is generally detectable in upper and lower respiratory specimens during the acute phase of infection. The lowest concentration of SARS-CoV-2 viral copies this assay can detect is 250 copies / mL. A negative result does not preclude SARS-CoV-2 infection and should not be used as the sole basis for treatment or other patient management decisions.  A negative result may occur with improper specimen collection / handling, submission of specimen other than nasopharyngeal swab, presence of viral mutation(s) within the areas targeted by this assay, and inadequate number of viral copies (<250 copies / mL). A negative result must be combined with clinical observations,  patient history, and epidemiological information. Fact Sheet for Patients:   StrictlyIdeas.no Fact Sheet for Healthcare Providers: BankingDealers.co.za This test is not yet approved or cleared  by the [place] FDA and has been authorized for detection and/or diagnosis of SARS-CoV-2 by FDA under an Emergency Use Authorization (EUA).  This EUA will remain in effect (meaning this test can be used) for the duration of the COVID-19 declaration under Section 564(b)(1) of the Act, 21 U.S.C. section 360bbb-3(b)(1), unless the authorization is terminated or revoked sooner. Performed at [facility], [address] [place], [place] [address]   Aerobic Culture (superficial specimen)     Status: None   Collection Time: 12/21/19 11:47 PM   Specimen: Buttocks  Result Value Ref Range Status   Specimen Description   Final    BUTTOCKS Performed at [facility], [address] [place], [place] [place] [address]    Special Requests   Final    Immunocompromised Performed at [facility], [address] [place], [place] [address]    Gram Stain   Final    MODERATE WBC PRESENT, PREDOMINANTLY PMN ABUNDANT GRAM NEGATIVE RODS ABUNDANT GRAM POSITIVE COCCI IN PAIRS RARE GRAM POSITIVE RODS    Culture   Final    FEW NORMAL SKIN FLORA Performed at [facility], [place] [address] [place], [place] [address]    Report Status 12/26/2019 FINAL  Final  Anaerobic culture  Status: None   Collection Time: 12/21/19 11:47 PM   Specimen: Buttocks  Result Value Ref Range Status   Specimen Description   Final    BUTTOCKS Performed at [facility], [address] [place], [place] [address]    Special Requests   Final    Immunocompromised Performed at [facility], [address] [place], [place] [address]    Gram Stain   Final    MODERATE WBC PRESENT, PREDOMINANTLY PMN ABUNDANT GRAM NEGATIVE  RODS ABUNDANT GRAM POSITIVE COCCI IN PAIRS RARE GRAM POSITIVE RODS    Culture   Final    MODERATE BACTEROIDES DISTASONIS BETA LACTAMASE NEGATIVE MODERATE BACTEROIDES FRAGILIS BETA LACTAMASE POSITIVE Performed at [facility], [place] [address] [place], [place] [address]    Report Status 12/27/2019 FINAL  Final    [x]  Patient discharged originally without antimicrobial agent and treatment is now indicated  New antibiotic prescription: Flagyl 500mg  TID x7 days   ED Provider: Emeterio Reeve PA-C   Phillis Haggis 12/28/2019, 9:23 AM Clinical Pharmacist Monday - Friday phone -  [phone] Saturday - Sunday phone - [phone]

## 2020-05-04 DEATH — deceased

## 2021-02-06 IMAGING — CT CT PELVIS W/O CM
2 of 3 series · 17 of 46 positions shown, 19 images · non-contrast
Comparison: None.

CLINICAL DATA: 43-year-old male with anal rectal abscess.
Glioblastoma.

EXAM:
CT PELVIS WITHOUT CONTRAST
TECHNIQUE: Multidetector CT imaging of the pelvis was performed following the
standard protocol without intravenous contrast.

[Series 2: axial st · axial · 0.88mm/px · z∈[-493,-198]mm · 14 of 69 slices shown, 16 images]
[im 5/69  soft-tissue]
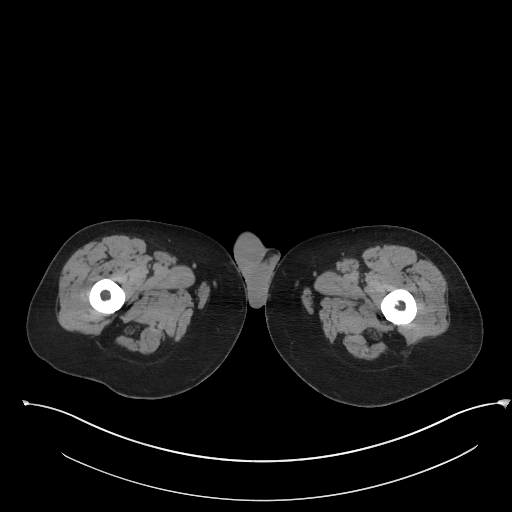
[im 5/69  bone]
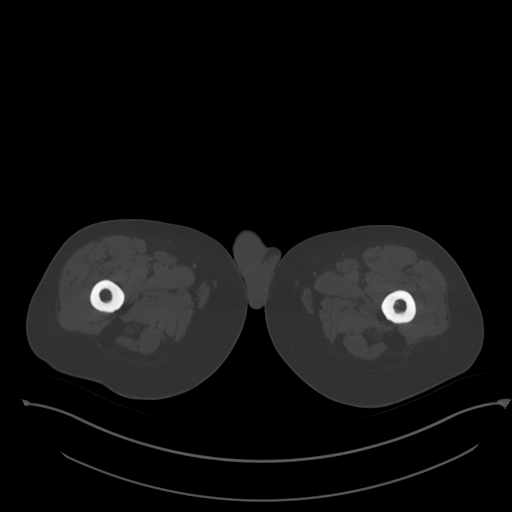
[im 9/69  soft-tissue]
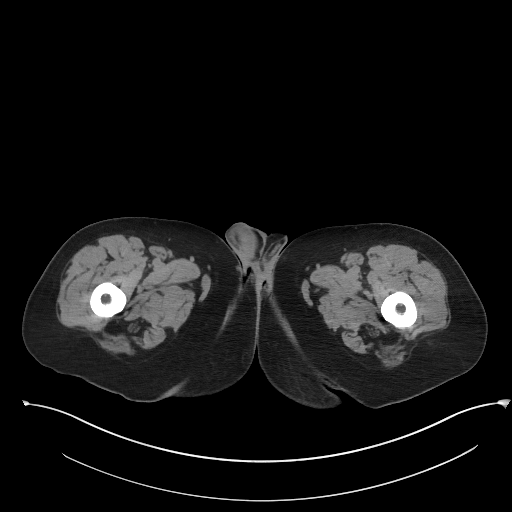
[im 14/69  soft-tissue]
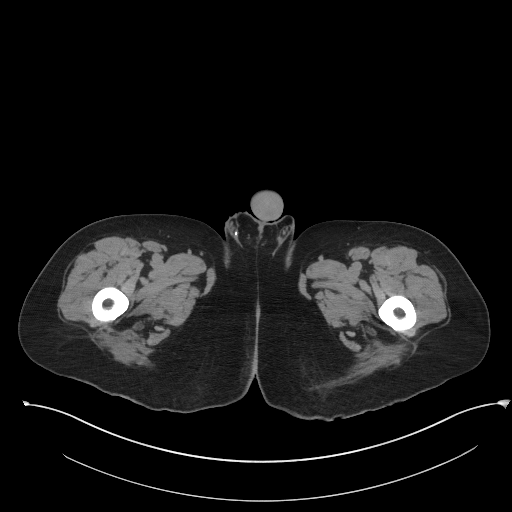
[im 18/69  soft-tissue]
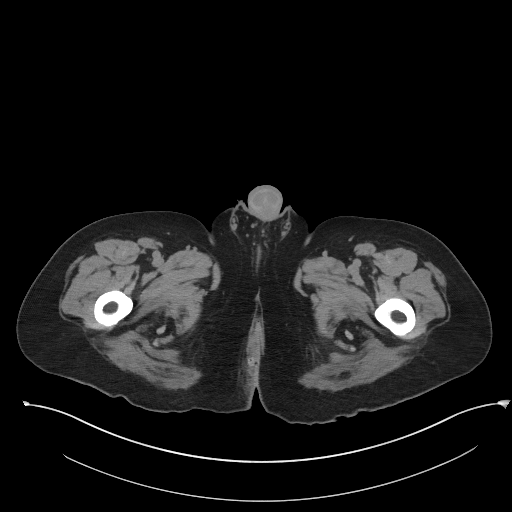
[im 22/69  soft-tissue]
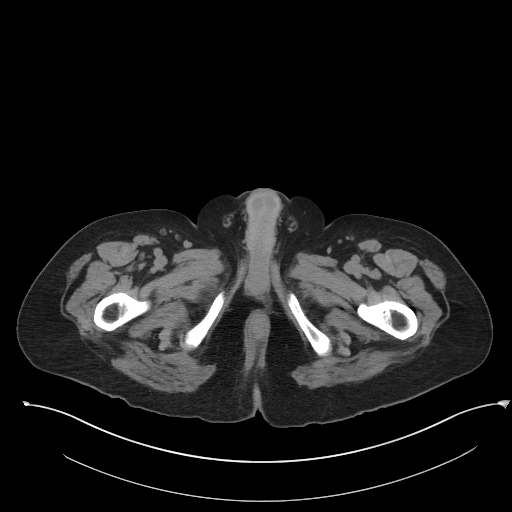
[im 27/69  soft-tissue]
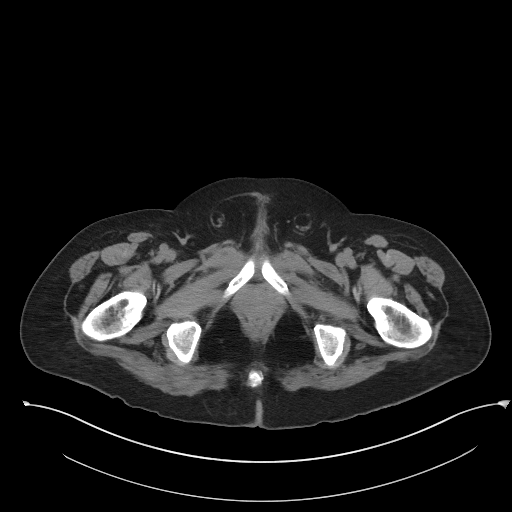
[im 31/69  soft-tissue]
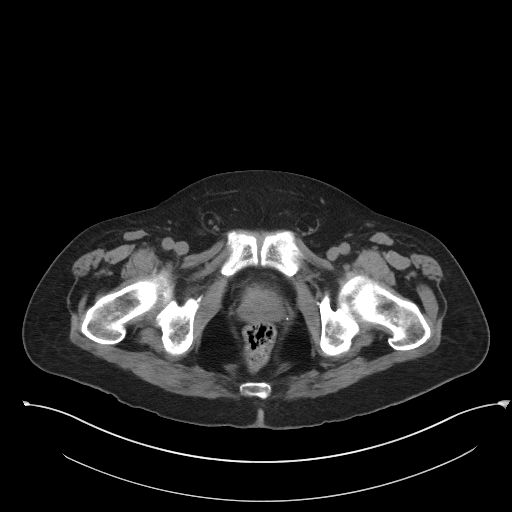
[im 38/69  soft-tissue]
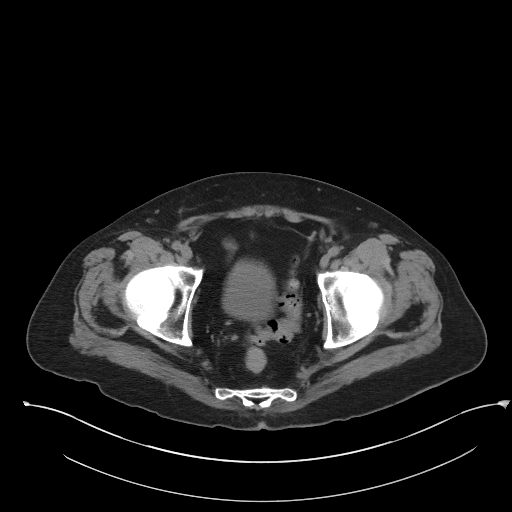
[im 42/69  soft-tissue]
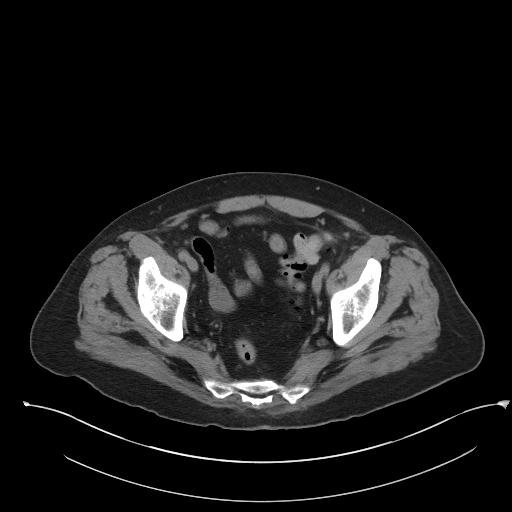
[im 42/69  bone]
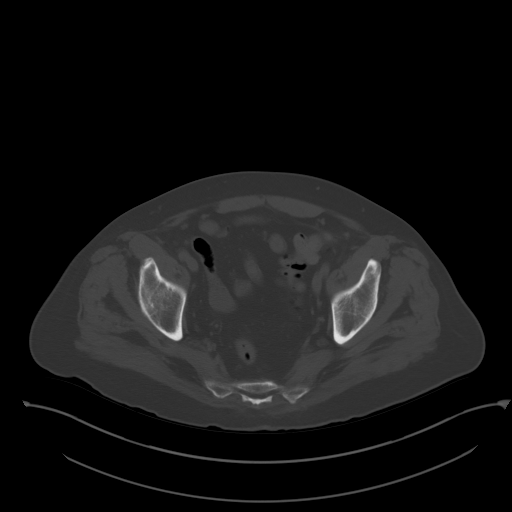
[im 47/69  soft-tissue]
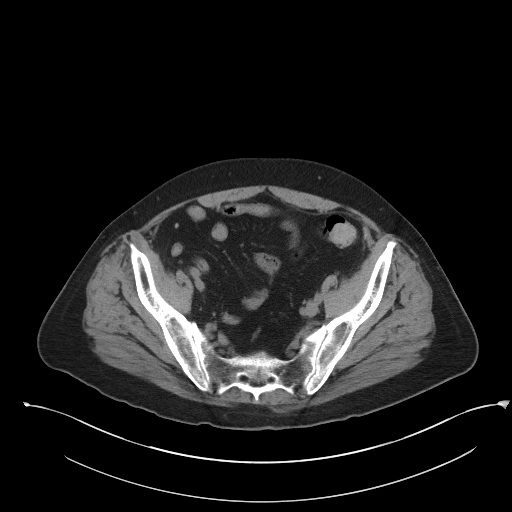
[im 51/69  soft-tissue]
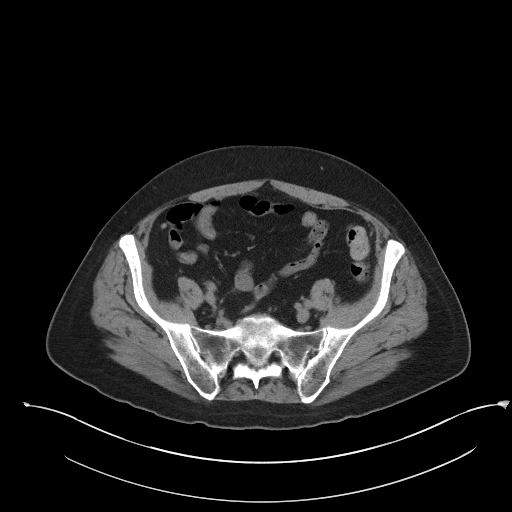
[im 55/69  soft-tissue]
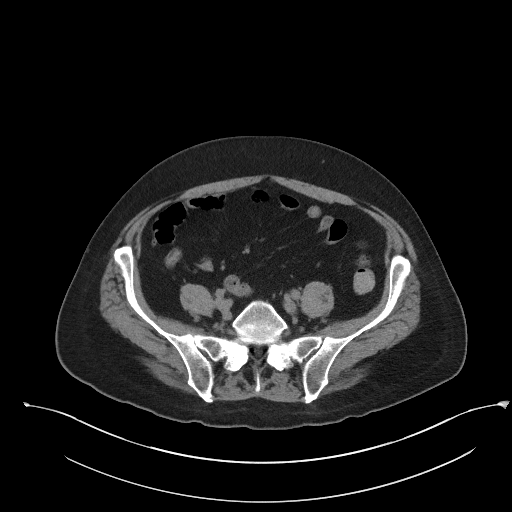
[im 60/69  soft-tissue]
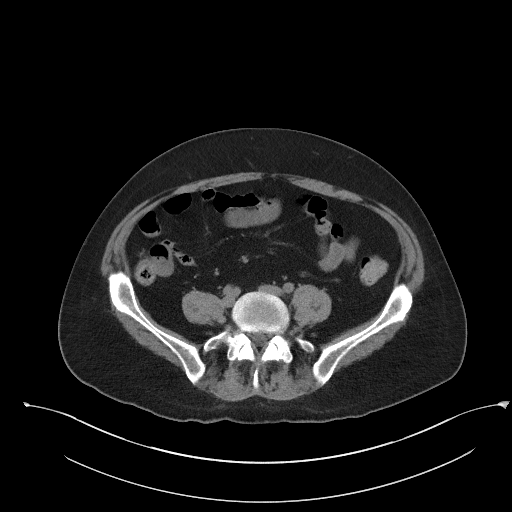
[im 64/69  soft-tissue]
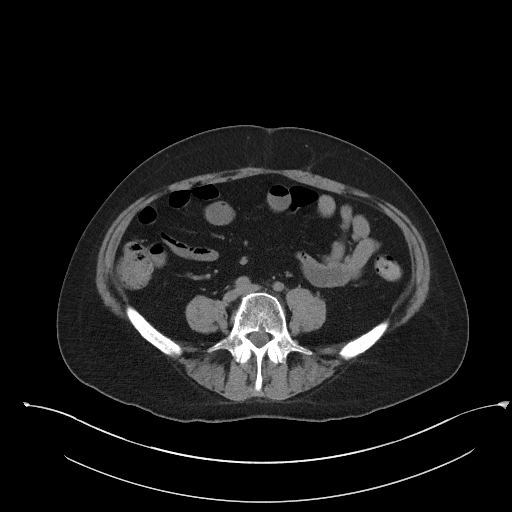

[Series 4: coronal st · coronal · 0.66mm/px · 3 of 94 slices shown]
[im 32/94  soft-tissue]
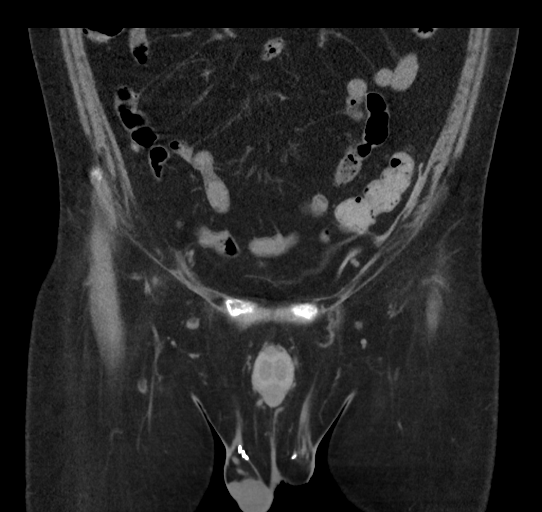
[im 42/94  soft-tissue]
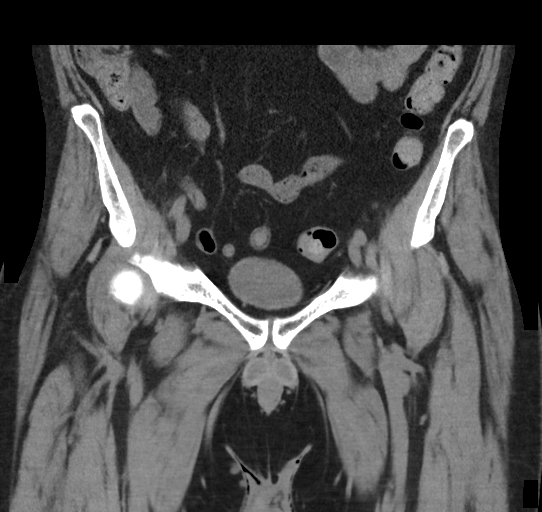
[im 52/94  soft-tissue]
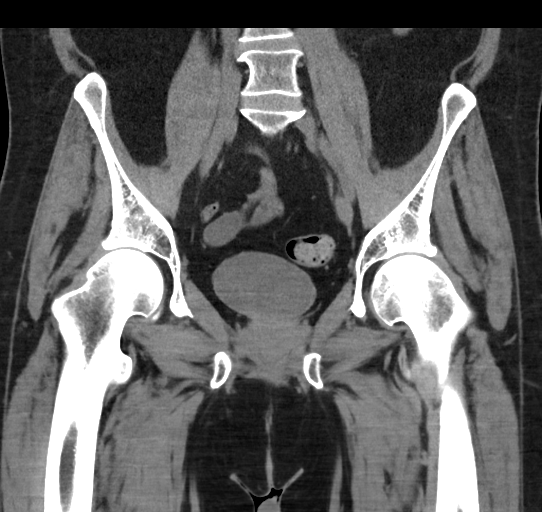

[17 of 46 positions shown; findings below may reference images not displayed]

FINDINGS: Urinary Tract:  Distal ureters and urinary bladder appear normal.

Bowel:

Right side perianal soft tissue thickening and inflammation with a
small collection of hyperdense material and soft tissue gas. See
series 2, image 52 and series 4 coronal image 70. This is 8-9 mm
diameter. Mild contralateral left subcutaneous inflammatory
appearing nodularity (series 2, image 50).

The upstream rectum appears to remain normal. Other visible large
and small bowel loops in the lower abdomen and pelvis appear
negative. Normal appendix on coronal image 30.

Vascular/Lymphatic: Vascular patency is not evaluated in the absence
of IV contrast. No atherosclerosis identified.

No pelvic or inguinal lymphadenopathy.

Reproductive:  Prior vasectomy, otherwise negative.

Other:  No pelvic free fluid.

Musculoskeletal: Negative.
IMPRESSION: 1. Small right Perianal Abscess (8 mm partially decompressed abscess
vs phlegmon). See series 2, image 52. Regional bilateral perianal
subcutaneous inflammation, greater on the right.

2. No pelvic floor involvement or other complicating features.
